# Patient Record
Sex: Male | Born: 2000 | Race: White | Hispanic: No | Marital: Single | Smoking: Never smoker
Health system: Southern US, Community
[De-identification: ages and names within clinical notes are randomized; demographics above are authoritative.]

## PROBLEM LIST (undated history)

## (undated) DIAGNOSIS — J45909 Unspecified asthma, uncomplicated: Secondary | ICD-10-CM

## (undated) HISTORY — PX: OTHER SURGICAL HISTORY: SHX169

## (undated) HISTORY — PX: TYMPANOSTOMY TUBE PLACEMENT: SHX32

---
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
## 2019-08-26 ENCOUNTER — Ambulatory Visit: Payer: BC Managed Care – PPO | Attending: Internal Medicine

## 2019-08-26 ENCOUNTER — Other Ambulatory Visit: Payer: Self-pay

## 2019-08-26 DIAGNOSIS — Z20822 Contact with and (suspected) exposure to covid-19: Secondary | ICD-10-CM

## 2019-08-28 LAB — NOVEL CORONAVIRUS, NAA: SARS-CoV-2, NAA: NOT DETECTED

## 2019-12-19 ENCOUNTER — Other Ambulatory Visit: Payer: Self-pay | Admitting: Physician Assistant

## 2019-12-19 ENCOUNTER — Ambulatory Visit (INDEPENDENT_AMBULATORY_CARE_PROVIDER_SITE_OTHER): Payer: Self-pay

## 2019-12-19 ENCOUNTER — Other Ambulatory Visit: Payer: Self-pay

## 2019-12-19 DIAGNOSIS — Z021 Encounter for pre-employment examination: Secondary | ICD-10-CM

## 2020-10-10 IMAGING — DX DG CHEST 2V
2 series · 2 of 2 positions shown · non-contrast
Comparison: 10/24/2004

CLINICAL DATA: Pre-employment physical

EXAM:
CHEST - 2 VIEW

[chest pa]
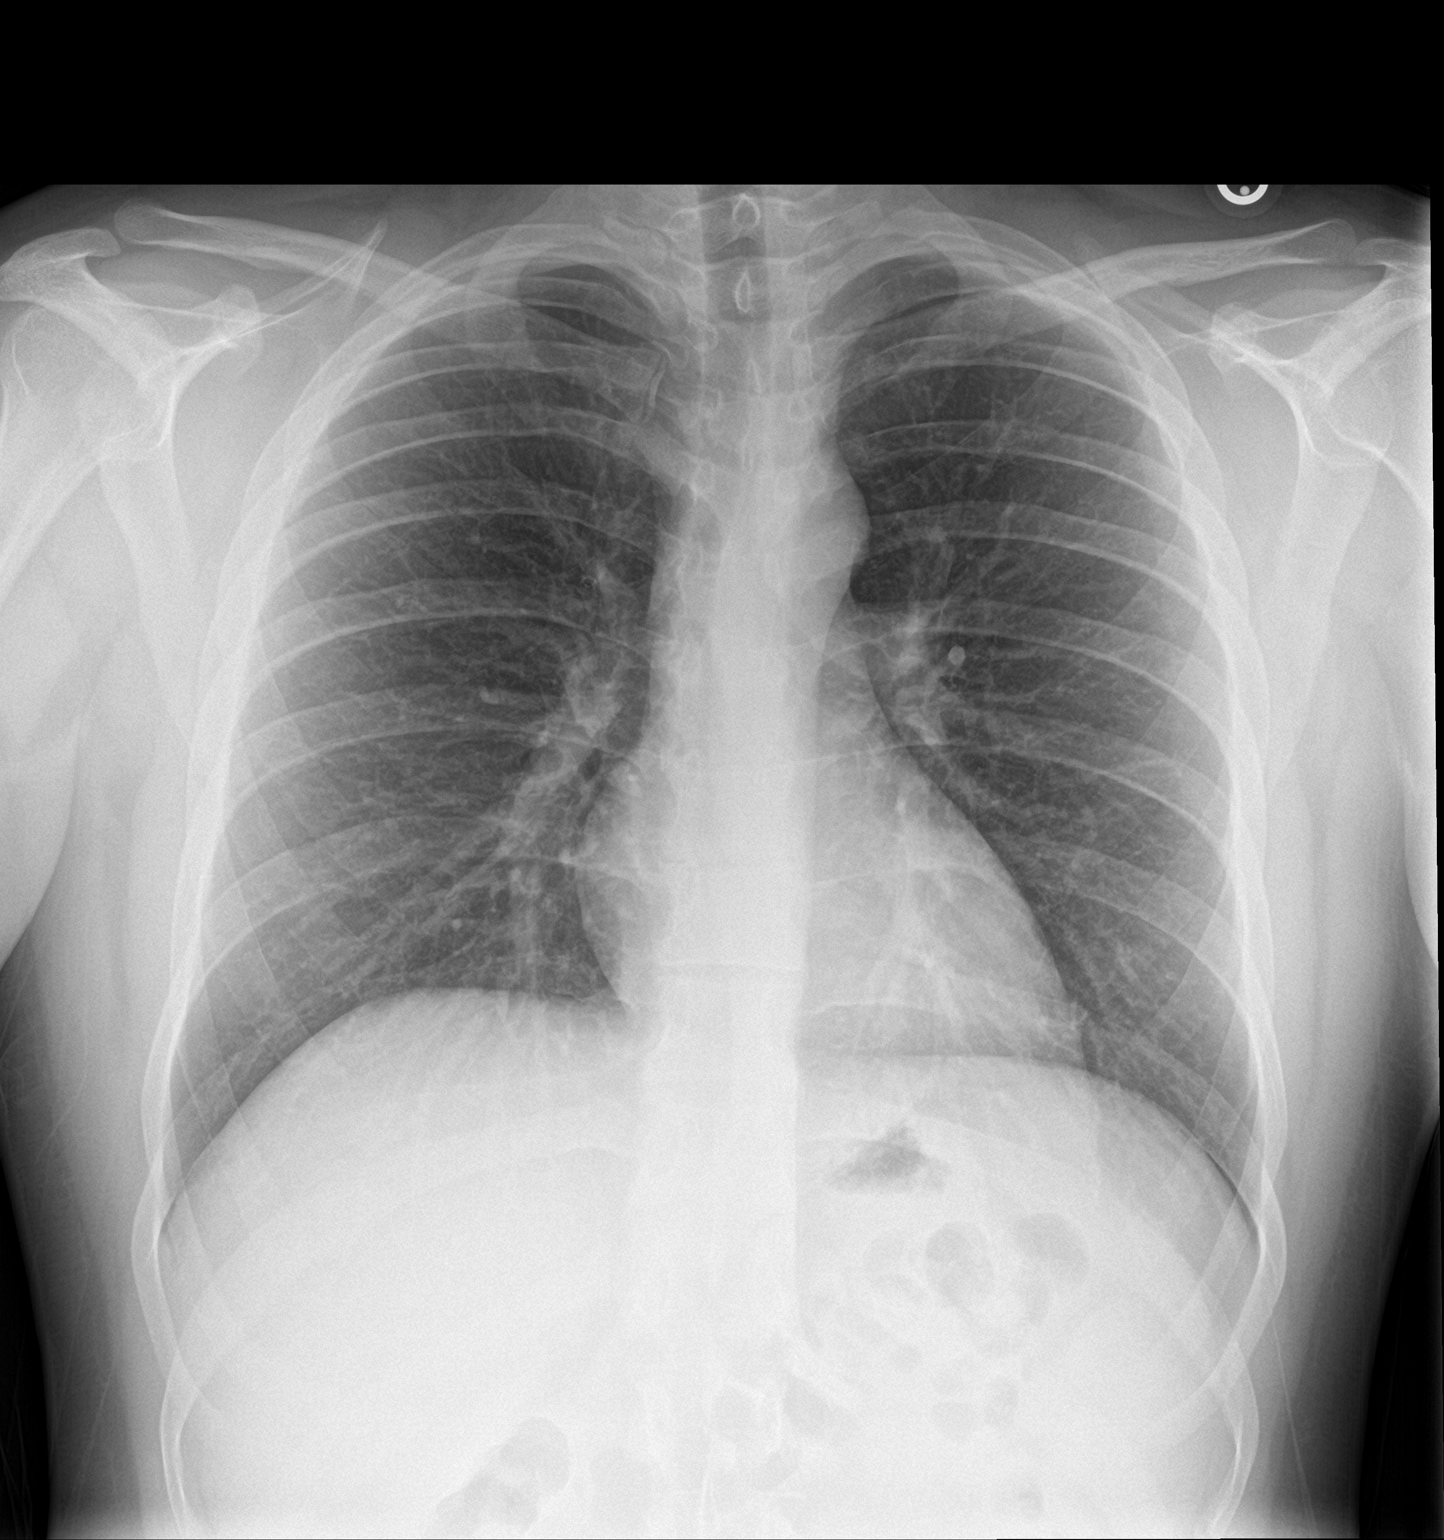

[chest lat]
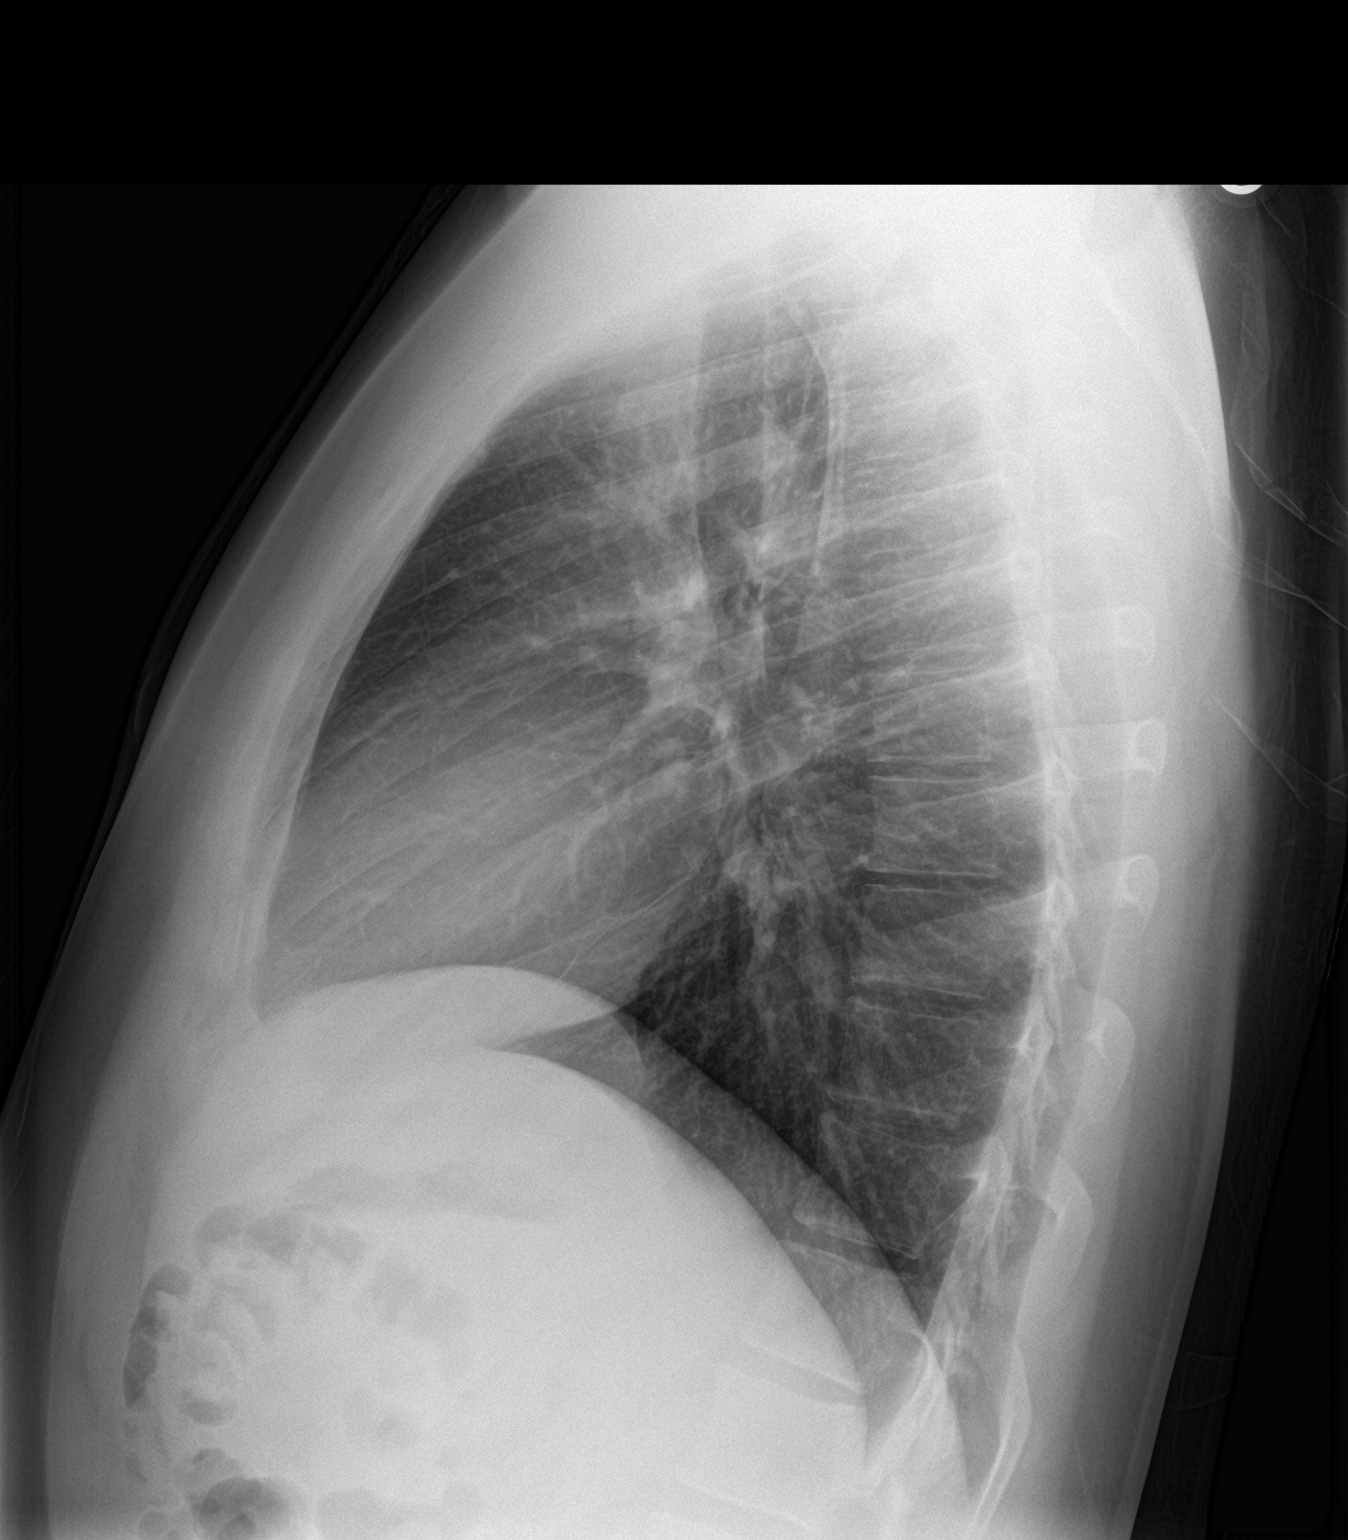

[2 of 2 positions shown; findings below may reference images not displayed]

FINDINGS: Normal heart size, mediastinal contours, and pulmonary vascularity.

Lungs clear.

No pleural effusion or pneumothorax.

Bones unremarkable.
IMPRESSION: Normal exam.

## 2022-07-24 ENCOUNTER — Ambulatory Visit
Admission: RE | Admit: 2022-07-24 | Discharge: 2022-07-24 | Disposition: A | Discharge status: Home / Self Care | Payer: BC Managed Care – PPO | Source: Ambulatory Visit | Attending: Nurse Practitioner | Admitting: Nurse Practitioner

## 2022-07-24 VITALS — BP 129/82 | HR 86 | Temp 99.5°F | Resp 18

## 2022-07-24 DIAGNOSIS — J069 Acute upper respiratory infection, unspecified: Secondary | ICD-10-CM | POA: Insufficient documentation

## 2022-07-24 DIAGNOSIS — Z1152 Encounter for screening for COVID-19: Secondary | ICD-10-CM | POA: Insufficient documentation

## 2022-07-24 DIAGNOSIS — J029 Acute pharyngitis, unspecified: Secondary | ICD-10-CM

## 2022-07-24 LAB — RESP PANEL BY RT-PCR (FLU A&B, COVID) ARPGX2
Influenza A by PCR: NEGATIVE
Influenza B by PCR: POSITIVE — AB
SARS Coronavirus 2 by RT PCR: NEGATIVE

## 2022-07-24 LAB — POCT RAPID STREP A (OFFICE): Rapid Strep A Screen: NEGATIVE

## 2022-07-24 MED ORDER — FLUTICASONE PROPIONATE 50 MCG/ACT NA SUSP: 2.0000 | Freq: Every day | NASAL | 0 refills | Status: AC

## 2022-07-24 MED ORDER — CETIRIZINE HCL 10 MG PO TABS: 10.0000 mg | ORAL_TABLET | Freq: Every day | ORAL | 0 refills | Status: AC

## 2022-07-24 MED ORDER — PSEUDOEPH-BROMPHEN-DM 30-2-10 MG/5ML PO SYRP: 5.0000 mL | ORAL_SOLUTION | Freq: Four times a day (QID) | ORAL | 0 refills | Status: AC | PRN

## 2022-07-24 NOTE — ED Triage Notes (Signed)
Sore throat, fever, cough that started 2 days ago. Taking OTC cold and flu medication to help.

## 2022-07-24 NOTE — Discharge Instructions (Addendum)
COVID/flu test is pending. The rapid strep test was negative. A throat culture is also pending. You will be contacted if the pending tests are positive. As discussed, you are a candidate to receive molnupiravir as antiviral treatment. Take medication as prescribed. Increase fluids and allow for plenty of rest. Recommend Tylenol or ibuprofen as needed for pain, fever, or general discomfort. Warm salt water gargles 3-4 times daily to help with throat pain or discomfort. Recommend using a humidifier at bedtime during sleep to help with cough and nasal congestion. Sleep elevated on 2 pillows. Follow-up if your symptoms do not improve.

## 2022-07-24 NOTE — ED Provider Notes (Signed)
[facility]    CSN: [id number] Arrival date & time: 07/24/22  1444      History   Chief Complaint Chief Complaint  Patient presents with   Sore Throat   Cough   Fever    HPI [name] [name] is a 21 y.o. male.   The history is provided by the patient.   Patient presents for complaints of cough, sore throat, nasal congestion, and fever that started approximately 2 days ago.  Patient denies ear pain, wheezing, difficulty breathing, or GI symptoms.  Patient reports that one of his friends at work has been sick with the same or similar symptoms.  He states that he has been taking over-the-counter cough and cold medication for his symptoms.  Patient also reports that he received 2 COVID vaccines.  He also states that he had COVID approximately 3 months ago.  Past Medical History:  Diagnosis Date   Asthma    Jaundice of newborn     Patient Active Problem List   Diagnosis Date Noted   Migraine with aura and without status migrainosus, not intractable 06/17/2014   Tension headache 06/17/2014   Asperger syndrome 06/17/2014    Past Surgical History:  Procedure Laterality Date   arm surgery     TYMPANOSTOMY TUBE PLACEMENT Bilateral    Performed at [facility] Medications    Prior to Admission medications   Medication Sig Start Date End Date Taking? Authorizing Provider  brompheniramine-pseudoephedrine-DM 30-2-10 MG/5ML syrup Take 5 mLs by mouth 4 (four) times daily as needed. 07/24/22  Yes Debbie Bellucci-Warren, Sadie Haber, NP  cetirizine (ZYRTEC) 10 MG tablet Take 1 tablet (10 mg total) by mouth daily. 07/24/22  Yes Latoy Labriola-Warren, Sadie Haber, NP  fluticasone (FLONASE) 50 MCG/ACT nasal spray Place 2 sprays into both nostrils daily. 07/24/22  Yes Deedee Lybarger-Warren, Sadie Haber, NP  ibuprofen (ADVIL,MOTRIN) 200 MG tablet Take 400 mg by mouth every 6 (six) hours as needed.   Yes [provider]  mometasone (NASONEX) 50 MCG/ACT nasal spray Place 2  sprays into the nose daily.    [provider]    Family History Family History  Problem Relation Age of Onset   Migraines Mother    Bipolar disorder Maternal Aunt    Migraines Maternal Grandmother    ADD / ADHD Cousin     Social History Social History   Tobacco Use   Smoking status: Never   Smokeless tobacco: Never  Substance Use Topics   Alcohol use: No   Drug use: No     Allergies   Other   Review of Systems Review of Systems Per HPI  Physical Exam Triage Vital Signs ED Triage Vitals [07/24/22 1530]  Enc Vitals Group     BP 129/82     Pulse Rate 86     Resp 18     Temp 99.5 F (37.5 C)     Temp Source Oral     SpO2 96 %     Weight      Height      Head Circumference      Peak Flow      Pain Score 5     Pain Loc      Pain Edu?      Excl. in GC?    No data found.  Updated Vital Signs BP 129/82 (BP Location: Right Arm)   Pulse 86   Temp 99.5 F (37.5 C) (Oral)   Resp  18   SpO2 96%   Visual Acuity Right Eye Distance:   Left Eye Distance:   Bilateral Distance:    Right Eye Near:   Left Eye Near:    Bilateral Near:     Physical Exam Vitals and nursing note reviewed.  Constitutional:      General: He is not in acute distress.    Appearance: He is well-developed.  HENT:     Head: Normocephalic.     Right Ear: Tympanic membrane and ear canal normal.     Left Ear: Tympanic membrane and ear canal normal.     Nose: Congestion present. No rhinorrhea.     Right Turbinates: Enlarged and swollen.     Left Turbinates: Enlarged and swollen.     Right Sinus: No maxillary sinus tenderness or frontal sinus tenderness.     Left Sinus: No maxillary sinus tenderness or frontal sinus tenderness.     Mouth/Throat:     Mouth: Mucous membranes are moist.     Pharynx: Uvula midline. Pharyngeal swelling and posterior oropharyngeal erythema present.     Tonsils: 1+ on the right. 1+ on the left.  Eyes:     Extraocular Movements:     Right eye:  Normal extraocular motion.     Left eye: Normal extraocular motion.     Conjunctiva/sclera: Conjunctivae normal.  Cardiovascular:     Rate and Rhythm: Regular rhythm.     Heart sounds: Normal heart sounds.  Pulmonary:     Effort: Pulmonary effort is normal. No respiratory distress.     Breath sounds: Normal breath sounds. No stridor. No wheezing, rhonchi or rales.  Abdominal:     General: Bowel sounds are normal.     Palpations: Abdomen is soft.     Tenderness: There is no abdominal tenderness.  Musculoskeletal:     Cervical back: Normal range of motion.  Lymphadenopathy:     Cervical: No cervical adenopathy.  Skin:    General: Skin is warm and dry.  Neurological:     General: No focal deficit present.     Mental Status: He is alert and oriented to person, place, and time.  Psychiatric:        Mood and Affect: Mood normal.        Behavior: Behavior normal.      UC Treatments / Results  Labs (all labs ordered are listed, but only abnormal results are displayed) Labs Reviewed  RESP PANEL BY RT-PCR (FLU A&B, COVID) [id number]  CULTURE, GROUP A STREP [facility])  POCT RAPID STREP A (OFFICE)    EKG   Radiology No results found.  Procedures Procedures (including critical care time)  Medications Ordered in UC Medications - No data to display  Initial Impression / Assessment and Plan / UC Course  I have reviewed the triage vital signs and the nursing notes.  Pertinent labs & imaging results that were available during my care of the patient were reviewed by me and considered in my medical decision making (see chart for details).  Patient presents for complaints of upper respiratory symptoms that been present for the past 2 days.  On exam, patient's vital signs are stable, he is well-appearing, and he is in no acute distress.  COVID/flu test is pending.  Rapid strep test was negative, throat culture is also pending.  Symptoms are consistent with a viral upper respiratory infection  based on the patient's presentation and symptoms.  Will treat patient with cetirizine 10 mg, Bromfed cough syrup, and  fluticasone 50 mcg nasal spray.  Supportive care recommendations were provided to the patient to include use of Tylenol or ibuprofen for pain, warm salt water gargles, and using a humidifier to help with this cough.  Indications of when to return with the patient.  Patient was also giving a note for work.  Patient verbalizes understanding.  All questions were answered.  Patient stable for discharge. Final Clinical Impressions(s) / UC Diagnoses   Final diagnoses:  Viral upper respiratory tract infection with cough  Encounter for screening for COVID-19     Discharge Instructions      COVID/flu test is pending. The rapid strep test was negative. A throat culture is also pending. You will be contacted if the pending tests are positive. As discussed, you are a candidate to receive molnupiravir as antiviral treatment. Take medication as prescribed. Increase fluids and allow for plenty of rest. Recommend Tylenol or ibuprofen as needed for pain, fever, or general discomfort. Warm salt water gargles 3-4 times daily to help with throat pain or discomfort. Recommend using a humidifier at bedtime during sleep to help with cough and nasal congestion. Sleep elevated on 2 pillows. Follow-up if your symptoms do not improve.      ED Prescriptions     Medication Sig Dispense Auth. Provider   cetirizine (ZYRTEC) 10 MG tablet Take 1 tablet (10 mg total) by mouth daily. 30 tablet Lyfe Reihl-Warren, Alda Lea, NP   fluticasone (FLONASE) 50 MCG/ACT nasal spray Place 2 sprays into both nostrils daily. 16 g Elaijah Munoz-Warren, Alda Lea, NP   brompheniramine-pseudoephedrine-DM 30-2-10 MG/5ML syrup Take 5 mLs by mouth 4 (four) times daily as needed. 140 mL Molleigh Huot-Warren, Alda Lea, NP      PDMP not reviewed this encounter.   Tish Men, NP 07/24/22 1555

## 2022-07-25 ENCOUNTER — Telehealth (HOSPITAL_COMMUNITY): Payer: Self-pay | Admitting: Emergency Medicine

## 2022-07-25 NOTE — Telephone Encounter (Signed)
Opened in error, patient decided against Tamiflu prescription

## 2022-07-27 LAB — CULTURE, GROUP A STREP (THRC)
